# Patient Record
Sex: Male | Born: 2000 | Race: Black or African American | Hispanic: No | Marital: Single | Smoking: Never smoker
Health system: Southern US, Community
[De-identification: ages and names within clinical notes are randomized; demographics above are authoritative.]

## PROBLEM LIST (undated history)

## (undated) DIAGNOSIS — R519 Headache, unspecified: Secondary | ICD-10-CM

## (undated) DIAGNOSIS — J302 Other seasonal allergic rhinitis: Secondary | ICD-10-CM

## (undated) DIAGNOSIS — R51 Headache: Secondary | ICD-10-CM

## (undated) HISTORY — PX: WISDOM TOOTH EXTRACTION: SHX21

## (undated) HISTORY — DX: Headache: R51

## (undated) HISTORY — DX: Headache, unspecified: R51.9

---
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
## 2020-08-30 ENCOUNTER — Encounter (INDEPENDENT_AMBULATORY_CARE_PROVIDER_SITE_OTHER): Payer: Self-pay

## 2020-10-16 IMAGING — DX DG RIBS W/ CHEST 3+V*L*
5 series · 5 of 5 positions shown · non-contrast
Comparison: None.

CLINICAL DATA: Left rib pain

EXAM:
LEFT RIBS AND CHEST - 3+ VIEW

[chest pa]
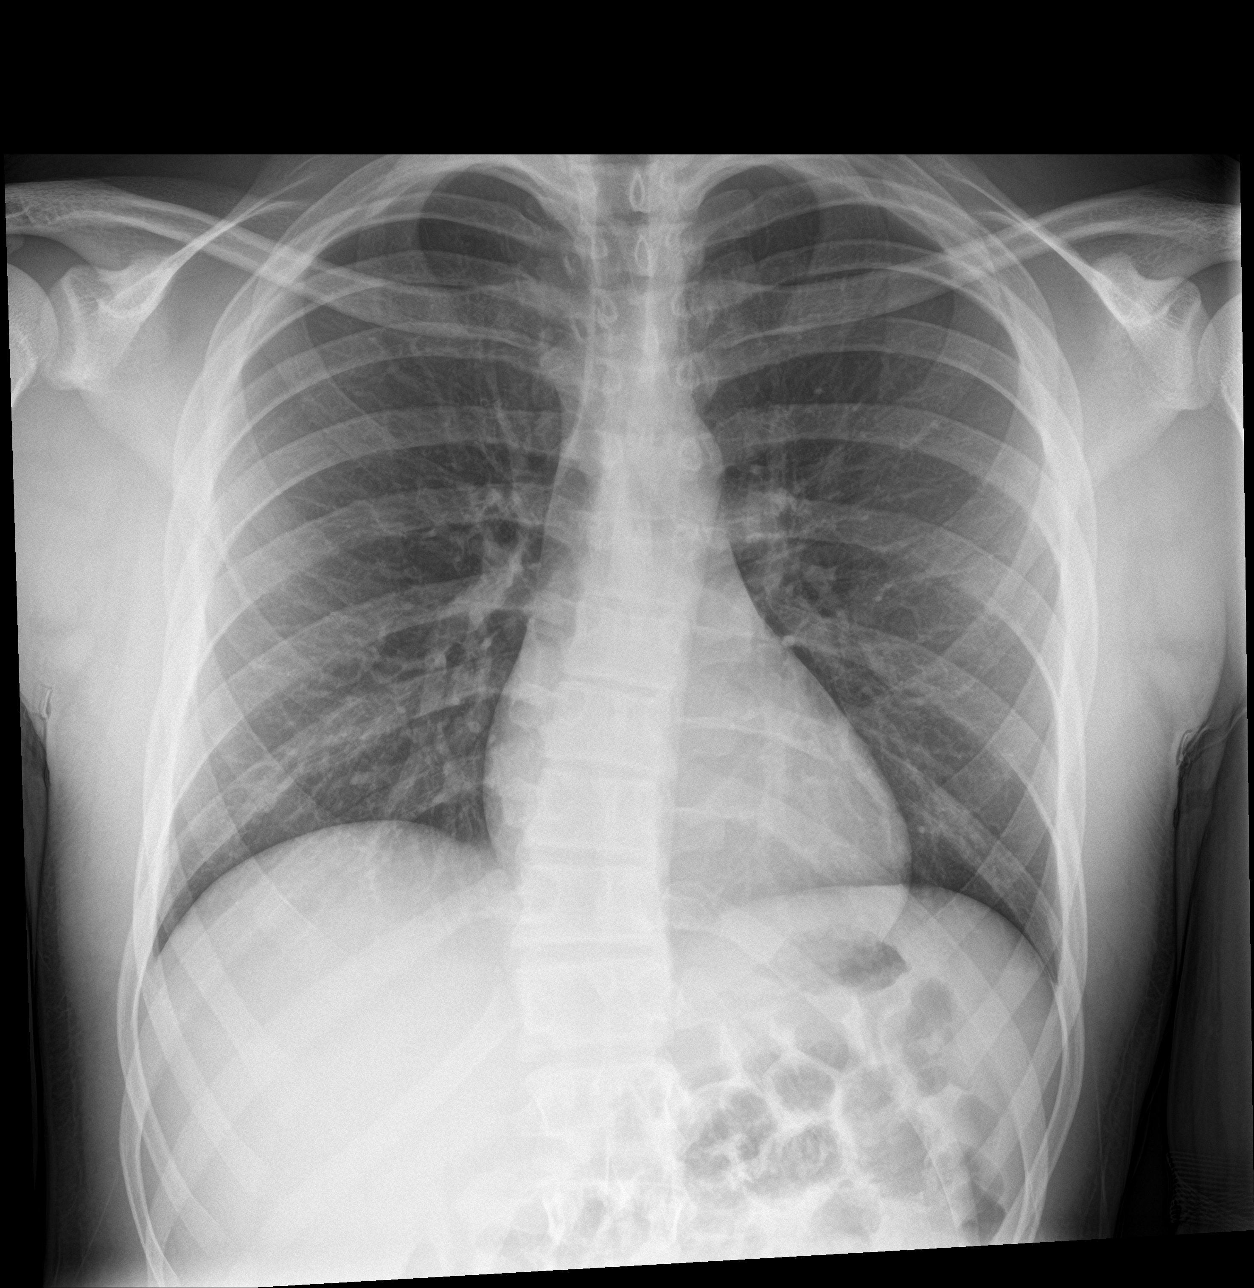

[rib pa obl (1 of 2)]
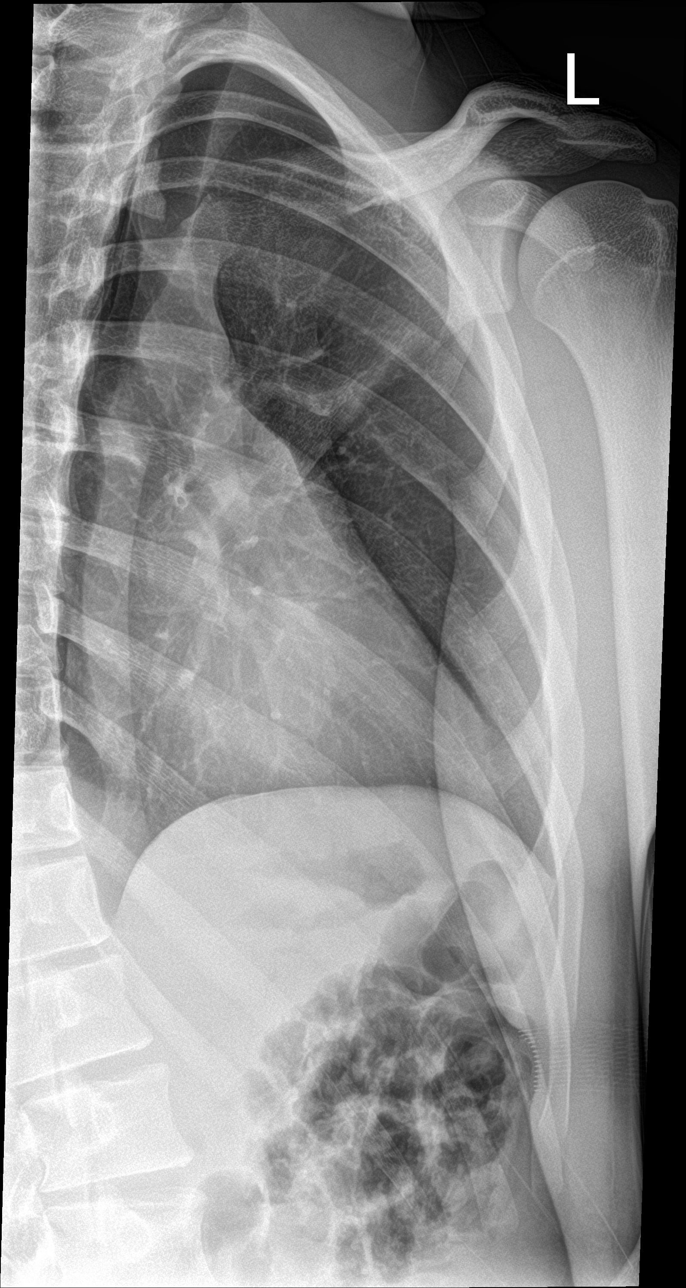

[rib pa obl (2 of 2)]
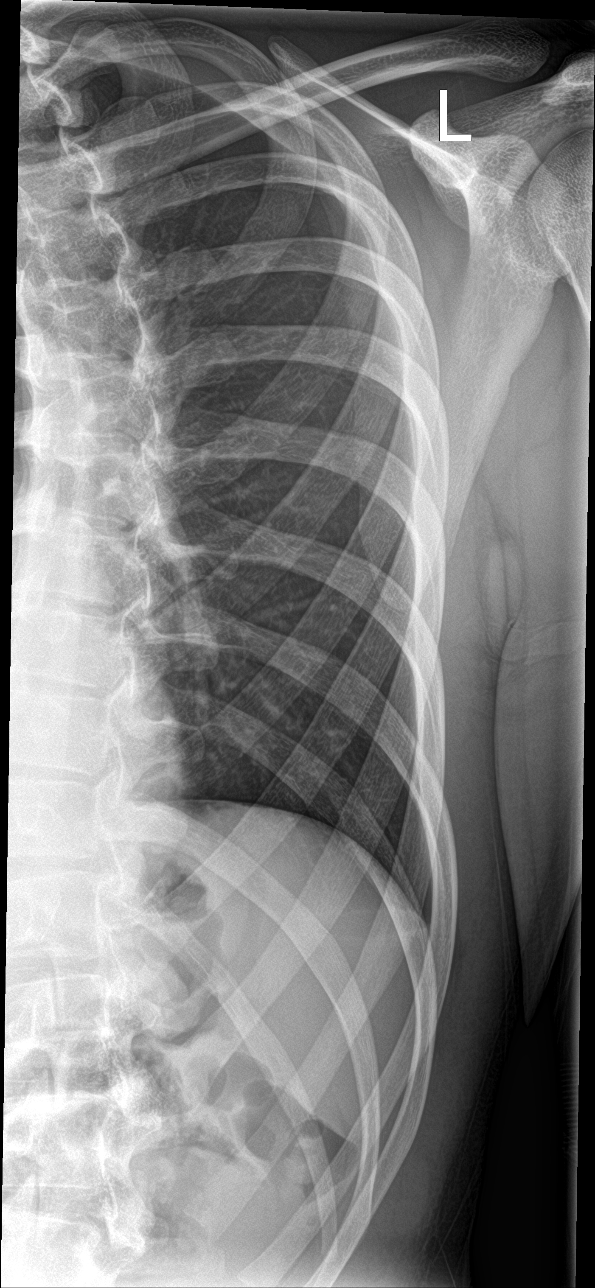

[rib pa]
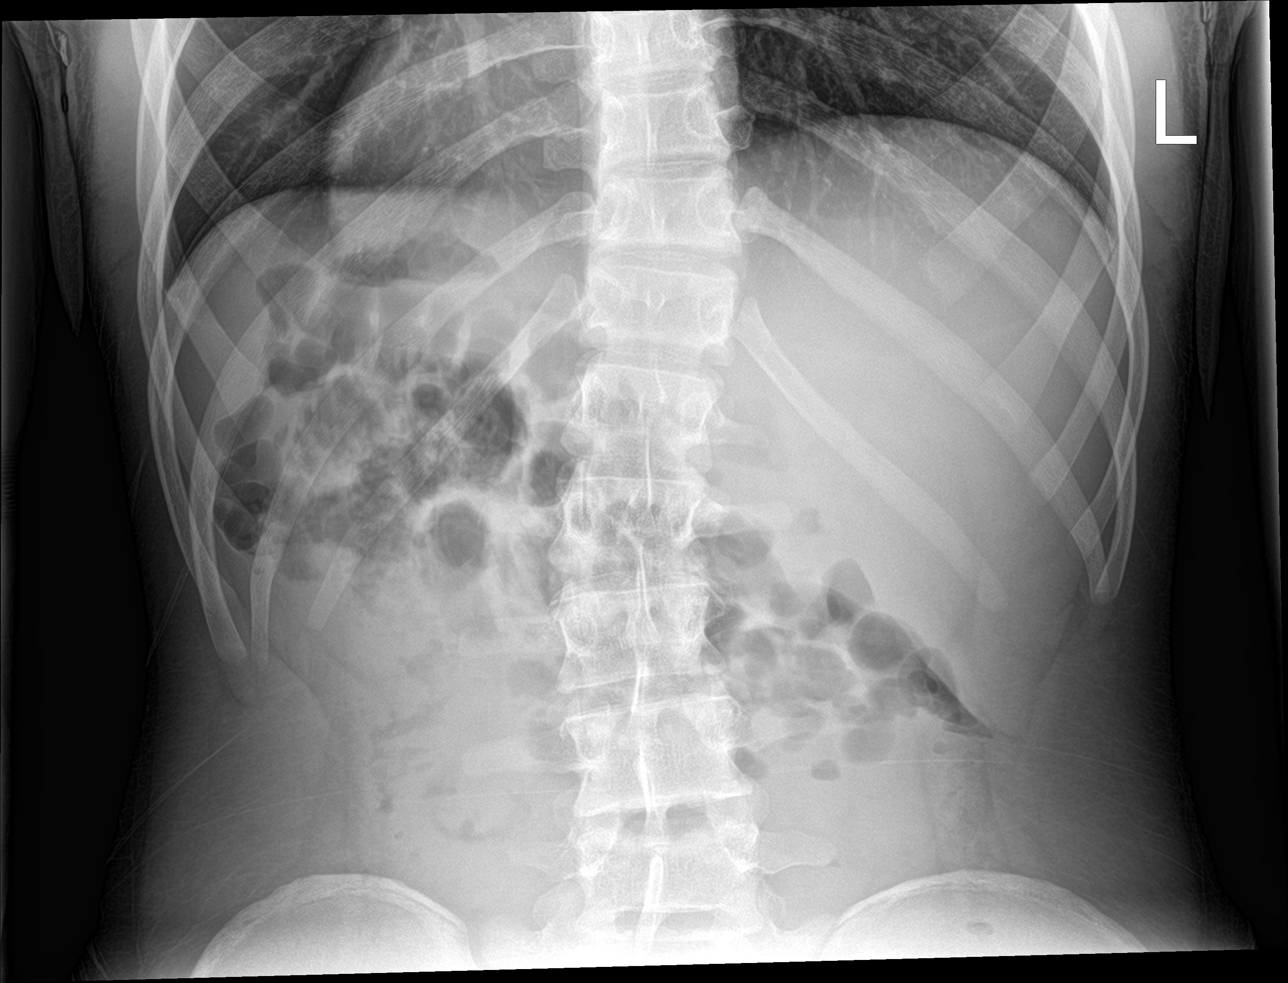

[rib ap]
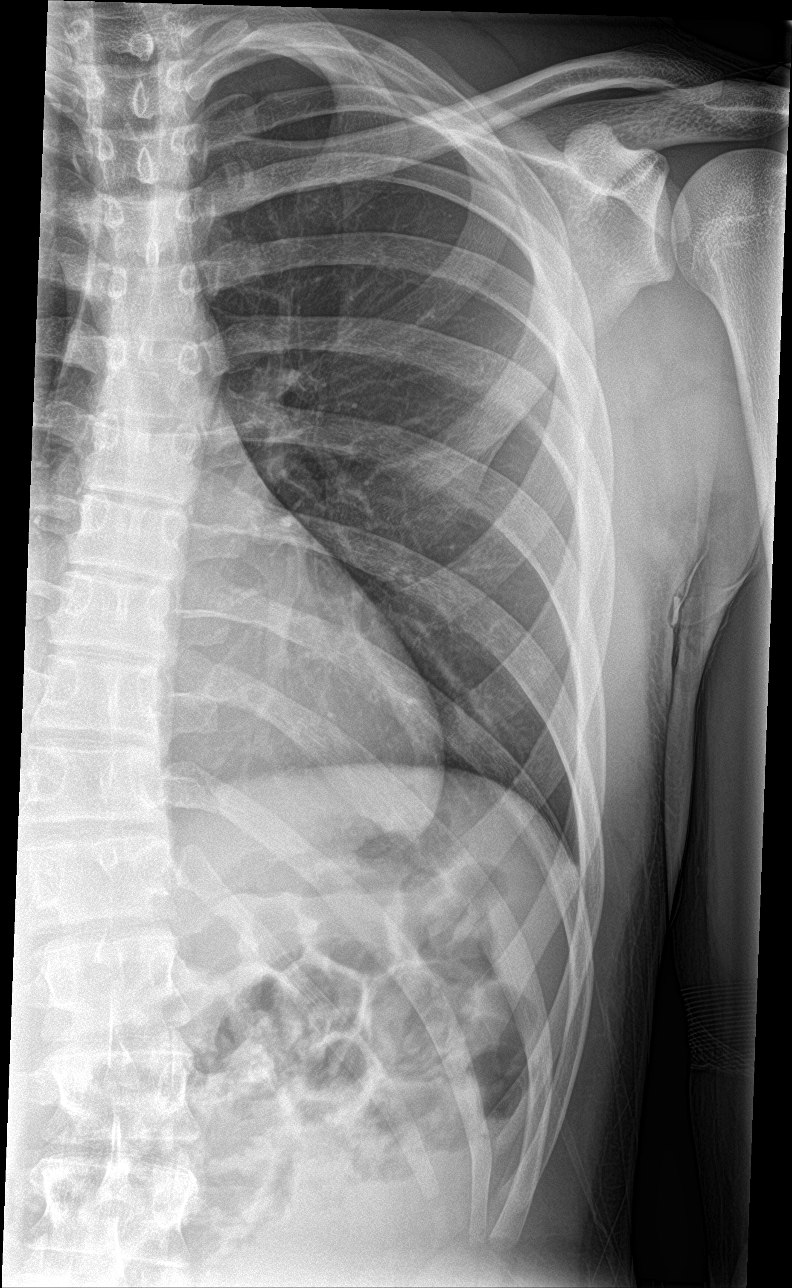

[5 of 5 positions shown; findings below may reference images not displayed]

FINDINGS: No fracture or other bone lesions are seen involving the ribs. There
is no evidence of pneumothorax or pleural effusion. Both lungs are
clear. Heart size and mediastinal contours are within normal limits.
IMPRESSION: Negative.

## 2020-11-13 ENCOUNTER — Emergency Department
Admission: RE | Admit: 2020-11-13 | Discharge: 2020-11-13 | Disposition: A | Discharge status: Home / Self Care | Payer: Managed Care, Other (non HMO) | Source: Ambulatory Visit | Attending: Family Medicine | Admitting: Family Medicine

## 2020-11-13 ENCOUNTER — Other Ambulatory Visit: Payer: Self-pay

## 2020-11-13 VITALS — BP 140/89 | HR 95 | Temp 99.3°F | Resp 18 | Ht 75.0 in | Wt 235.0 lb

## 2020-11-13 DIAGNOSIS — J039 Acute tonsillitis, unspecified: Secondary | ICD-10-CM | POA: Diagnosis not present

## 2020-11-13 HISTORY — DX: Other seasonal allergic rhinitis: J30.2

## 2020-11-13 LAB — POCT RAPID STREP A (OFFICE): Rapid Strep A Screen: NEGATIVE

## 2020-11-13 MED ORDER — DEXAMETHASONE 10 MG/ML FOR PEDIATRIC ORAL USE
10.0000 mg | Freq: Once | INTRAMUSCULAR | Status: AC
  Administered 2020-11-13: 10 mg via ORAL

## 2020-11-13 MED ORDER — AMOXICILLIN 875 MG PO TABS: 875.0000 mg | ORAL_TABLET | Freq: Two times a day (BID) | ORAL | 0 refills | Status: AC

## 2020-11-13 NOTE — ED Triage Notes (Signed)
Pt presents to Urgent Care with c/o swollen lymph nodes in throat/upper neck area which are painful and also bilateral otalgia x 4-5 days.

## 2020-11-13 NOTE — ED Provider Notes (Signed)
[facility]    CSN: [id number] Arrival date & time: 11/13/20  1349      History   Chief Complaint Chief Complaint  Patient presents with   Lymphadenopathy   Otalgia    HPI Jimmy Mcdonald is a 20 y.o. male.   HPI  Healthy 20 year old gentleman.  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  He is around kids all day long.  He is currently here with a sore throat.  Painful swallowing.  Ear pressure and pain.  Mild headache.  No fever or chills.  No malaise or fatigue.  No body aches.  Some decreased appetite.  Past Medical History:  Diagnosis Date   Headache    Seasonal allergies     Patient Active Problem List   Diagnosis Date Noted   Migraine without aura and without status migrainosus, not intractable 01/12/2018   Episodic tension-type headache, not intractable 01/12/2018    Past Surgical History:  Procedure Laterality Date   WISDOM TOOTH EXTRACTION         Home Medications    Prior to Admission medications   Medication Sig Start Date End Date Taking? Authorizing Provider  amoxicillin (AMOXIL) 875 MG tablet Take 1 tablet (875 mg total) by mouth 2 (two) times daily for 10 days. 11/13/20 11/23/20 Yes Eustace Moore, MD  ibuprofen (ADVIL) 800 MG tablet Take 1 tablet (800 mg total) by mouth every 8 (eight) hours as needed. 07/16/19   Roxy Horseman, PA-C  montelukast (SINGULAIR) 10 MG tablet  12/02/17   [provider]    Family History Family History  Problem Relation Age of Onset   Migraines Mother    Kidney disease Mother    Diabetes Father    Migraines Father     Social History Social History   Tobacco Use   Smoking status: Never   Smokeless tobacco: Never  Vaping Use   Vaping Use: Never used  Substance Use Topics   Alcohol use: No   Drug use: No     Allergies   Peanut-containing drug products, Shellfish allergy, and Soy allergy   Review of Systems Review of Systems See HPI  Physical Exam Triage Vital Signs ED Triage  Vitals  Enc Vitals Group     BP 11/13/20 1417 140/89     Pulse Rate 11/13/20 1417 95     Resp 11/13/20 1417 18     Temp 11/13/20 1417 99.3 F (37.4 C)     Temp Source 11/13/20 1417 Oral     SpO2 11/13/20 1417 97 %     Weight 11/13/20 1413 235 lb (106.6 kg)     Height 11/13/20 1413 6\' 3"  (1.905 m)     Head Circumference --      Peak Flow --      Pain Score 11/13/20 1413 8     Pain Loc --      Pain Edu? --      Excl. in GC? --    No data found.  Updated Vital Signs BP 140/89   Pulse 95   Temp 99.3 F (37.4 C) (Oral)   Resp 18   Ht 6\' 3"  (1.905 m)   Wt 106.6 kg   SpO2 97%   BMI 29.37 kg/m      Physical Exam Constitutional:      General: He is not in acute distress.    Appearance: He is well-developed.  HENT:     Head: Normocephalic and atraumatic.     Right  Ear: Tympanic membrane and ear canal normal.     Left Ear: Tympanic membrane and ear canal normal.     Nose: Nose normal. No congestion.     Mouth/Throat:     Mouth: Mucous membranes are moist.     Pharynx: Posterior oropharyngeal erythema present.     Comments: Large erythematous tonsils at almost touching midline.  No exudate.  Bilateral anterior cervical nodes at the angle of the jaw, tender mobile Eyes:     Conjunctiva/sclera: Conjunctivae normal.     Pupils: Pupils are equal, round, and reactive to light.  Cardiovascular:     Rate and Rhythm: Normal rate and regular rhythm.     Heart sounds: Normal heart sounds.  Pulmonary:     Effort: Pulmonary effort is normal. No respiratory distress.     Breath sounds: Normal breath sounds.  Abdominal:     General: There is no distension.     Palpations: Abdomen is soft.  Musculoskeletal:        General: Normal range of motion.     Cervical back: Normal range of motion.  Skin:    General: Skin is warm and dry.     Findings: No rash.  Neurological:     Mental Status: He is alert.     UC Treatments / Results  Labs (all labs ordered are listed, but only  abnormal results are displayed) Labs Reviewed  CULTURE, GROUP A STREP  POCT RAPID STREP A (OFFICE)    EKG   Radiology No results found.  Procedures Procedures (including critical care time)  Medications Ordered in UC Medications  dexamethasone (DECADRON) 10 MG/ML injection for Pediatric ORAL use 10 mg (10 mg Oral Given 11/13/20 1515)    Initial Impression / Assessment and Plan / UC Course  I have reviewed the triage vital signs and the nursing notes.  Pertinent labs & imaging results that were available during my care of the patient were reviewed by me and considered in my medical decision making (see chart for details).     Rapid strep is negative.  Will place on antibiotics pending throat culture given the tonsillitis appearance and painful throat.  Discussed pain management.  I gave him 1 dose of Decadron for the tonsillar swelling.  He knows to go to the ER if he gets worse instead of better has trouble swallowing or breathing Final Clinical Impressions(s) / UC Diagnoses   Final diagnoses:  Acute tonsillitis, unspecified etiology     Discharge Instructions      Take the amoxicillin 2 x a day Check My Chart for culture result If the culture is negative for strep you may stop the amoxicillin as soon as you improve If positive be sure to take 10 full days May take Tylenol or ibuprofen for pain. May try salt water gargles or Chloraseptic spray for throat pain. Call or return if not improving by the end of the week   ED Prescriptions     Medication Sig Dispense Auth. Provider   amoxicillin (AMOXIL) 875 MG tablet Take 1 tablet (875 mg total) by mouth 2 (two) times daily for 10 days. 20 tablet Eustace Moore, MD      PDMP not reviewed this encounter.   Eustace Moore, MD 11/13/20 [phone]

## 2020-11-13 NOTE — Discharge Instructions (Addendum)
Take the amoxicillin 2 x a day Check My Chart for culture result If the culture is negative for strep you may stop the amoxicillin as soon as you improve If positive be sure to take 10 full days May take Tylenol or ibuprofen for pain. May try salt water gargles or Chloraseptic spray for throat pain. Call or return if not improving by the end of the week

## 2020-11-15 LAB — CULTURE, GROUP A STREP: Strep A Culture: NEGATIVE

## 2020-12-07 ENCOUNTER — Emergency Department
Admission: EM | Admit: 2020-12-07 | Discharge: 2020-12-07 | Disposition: A | Discharge status: Home / Self Care | Payer: Managed Care, Other (non HMO) | Source: Home / Self Care

## 2020-12-07 ENCOUNTER — Other Ambulatory Visit: Payer: Self-pay

## 2020-12-07 ENCOUNTER — Encounter: Payer: Self-pay | Admitting: Emergency Medicine

## 2020-12-07 DIAGNOSIS — J039 Acute tonsillitis, unspecified: Secondary | ICD-10-CM | POA: Diagnosis not present

## 2020-12-07 DIAGNOSIS — J029 Acute pharyngitis, unspecified: Secondary | ICD-10-CM | POA: Diagnosis not present

## 2020-12-07 DIAGNOSIS — J309 Allergic rhinitis, unspecified: Secondary | ICD-10-CM | POA: Diagnosis not present

## 2020-12-07 MED ORDER — FEXOFENADINE HCL 180 MG PO TABS: 180.0000 mg | ORAL_TABLET | Freq: Every day | ORAL | 0 refills | Status: DC

## 2020-12-07 MED ORDER — AMOXICILLIN-POT CLAVULANATE 875-125 MG PO TABS: 1.0000 | ORAL_TABLET | Freq: Two times a day (BID) | ORAL | 0 refills | Status: DC

## 2020-12-07 NOTE — Discharge Instructions (Addendum)
Advised patient to take medication as directed with food to completion.  Advised patient to take Allegra daily with first dose of antibiotic for the next 7 days.  Advised/encouraged patient to increase daily water intake when taking these medications.  Advised/encouraged patient to follow-up with PCP for ENT consult for removal of tonsils.

## 2020-12-07 NOTE — ED Triage Notes (Signed)
Sore throat started 1 week after finishing antibiotics Productive cough x 1 week  Denies fever  Ibuprofen for pain - 200mg  at 0900 COVID 06/2019 No COVID vaccine

## 2020-12-07 NOTE — ED Provider Notes (Signed)
[facility]    CSN: [id number] Arrival date & time: 12/07/20  1138      History   Chief Complaint Chief Complaint  Patient presents with   Sore Throat    HPI Jimmy Mcdonald is a 20 y.o. male.   HPI 20 year old male presents with sore throat that started 1 week after finishing antibiotics prescribed on 11/13/2020 for acute tonsillitis.  Reports productive cough x7 days.  Denies fever and is not vaccinated for COVID-19.  Past Medical History:  Diagnosis Date   Headache    Seasonal allergies     Patient Active Problem List   Diagnosis Date Noted   Migraine without aura and without status migrainosus, not intractable 01/12/2018   Episodic tension-type headache, not intractable 01/12/2018    Past Surgical History:  Procedure Laterality Date   WISDOM TOOTH EXTRACTION         Home Medications    Prior to Admission medications   Medication Sig Start Date End Date Taking? Authorizing Provider  amoxicillin-clavulanate (AUGMENTIN) 875-125 MG tablet Take 1 tablet by mouth every 12 (twelve) hours. 12/07/20  Yes Trevor Iha, FNP  fexofenadine [facility] ALLERGY) 180 MG tablet Take 1 tablet (180 mg total) by mouth daily for 15 days. 12/07/20 12/22/20 Yes Trevor Iha, FNP  montelukast (SINGULAIR) 10 MG tablet  12/02/17  Yes [provider]  ibuprofen (ADVIL) 800 MG tablet Take 1 tablet (800 mg total) by mouth every 8 (eight) hours as needed. Patient not taking: Reported on 12/07/2020 07/16/19   Roxy Horseman, PA-C    Family History Family History  Problem Relation Age of Onset   Migraines Mother    Kidney disease Mother    Diabetes Father    Migraines Father     Social History Social History   Tobacco Use   Smoking status: Never   Smokeless tobacco: Never  Vaping Use   Vaping Use: Never used  Substance Use Topics   Alcohol use: No   Drug use: No     Allergies   Peanut-containing drug products, Shellfish allergy, and Soy  allergy   Review of Systems Review of Systems  HENT:  Positive for sore throat.   Respiratory:  Positive for cough.   All other systems reviewed and are negative.   Physical Exam Triage Vital Signs ED Triage Vitals  Enc Vitals Group     BP 12/07/20 1203 120/85     Pulse Rate 12/07/20 1203 69     Resp 12/07/20 1203 15     Temp 12/07/20 1203 98.5 F (36.9 C)     Temp Source 12/07/20 1203 Oral     SpO2 12/07/20 1203 98 %     Weight 12/07/20 1206 235 lb 0.2 oz (106.6 kg)     Height --      Head Circumference --      Peak Flow --      Pain Score 12/07/20 1205 4     Pain Loc --      Pain Edu? --      Excl. in GC? --    No data found.  Updated Vital Signs BP 120/85 (BP Location: Right Arm)   Pulse 69   Temp 98.5 F (36.9 C) (Oral)   Resp 15   Wt 235 lb 0.2 oz (106.6 kg)   SpO2 98%   BMI 29.37 kg/m      Physical Exam Vitals and nursing note reviewed.  Constitutional:      General: He is not  in acute distress.    Appearance: Normal appearance. He is normal weight. He is not ill-appearing.  HENT:     Head: Normocephalic and atraumatic.     Mouth/Throat:     Lips: Pink.     Pharynx: Oropharynx is clear. Uvula midline. Posterior oropharyngeal erythema and uvula swelling present.     Tonsils: 3+ on the right. 3+ on the left.     Comments: Moderate amount of clear drainage of posterior oropharynx noted Eyes:     Extraocular Movements: Extraocular movements intact.     Conjunctiva/sclera: Conjunctivae normal.     Pupils: Pupils are equal, round, and reactive to light.  Cardiovascular:     Rate and Rhythm: Normal rate and regular rhythm.     Pulses: Normal pulses.     Heart sounds: Normal heart sounds.  Pulmonary:     Effort: Pulmonary effort is normal.     Breath sounds: Normal breath sounds.  Musculoskeletal:        General: Normal range of motion.     Cervical back: Normal range of motion and neck supple. Tenderness present.  Lymphadenopathy:     Cervical:  Cervical adenopathy present.  Skin:    General: Skin is warm and dry.  Neurological:     General: No focal deficit present.     Mental Status: He is alert and oriented to person, place, and time. Mental status is at baseline.  Psychiatric:        Mood and Affect: Mood normal.        Behavior: Behavior normal.        Thought Content: Thought content normal.     UC Treatments / Results  Labs (all labs ordered are listed, but only abnormal results are displayed) Labs Reviewed - No data to display  EKG   Radiology No results found.  Procedures Procedures (including critical care time)  Medications Ordered in UC Medications - No data to display  Initial Impression / Assessment and Plan / UC Course  I have reviewed the triage vital signs and the nursing notes.  Pertinent labs & imaging results that were available during my care of the patient were reviewed by me and considered in my medical decision making (see chart for details).     MDM: 1.  Acute tonsillitis-Rx'd Augmentin; 2.  Allergic rhinitis-Rx'd Allegra. Advised patient to take medication as directed with food to completion.  Advised patient to take Allegra daily with first dose of antibiotic for the next 7 days.  Advised/encouraged patient to increase daily water intake when taking these medications.  Advised/encouraged patient to follow-up with PCP for ENT consult for removal of tonsils. Final Clinical Impressions(s) / UC Diagnoses   Final diagnoses:  Sore throat  Acute tonsillitis, unspecified etiology  Allergic rhinitis, unspecified seasonality, unspecified trigger     Discharge Instructions      Advised patient to take medication as directed with food to completion.  Advised patient to take Allegra daily with first dose of antibiotic for the next 7 days.  Advised/encouraged patient to increase daily water intake when taking these medications.  Advised/encouraged patient to follow-up with PCP for ENT consult  for removal of tonsils.     ED Prescriptions     Medication Sig Dispense Auth. Provider   amoxicillin-clavulanate (AUGMENTIN) 875-125 MG tablet Take 1 tablet by mouth every 12 (twelve) hours. 14 tablet Trevor Iha, FNP   fexofenadine [facility] ALLERGY) 180 MG tablet Take 1 tablet (180 mg total) by mouth daily  for 15 days. 15 tablet Trevor Iha, FNP      PDMP not reviewed this encounter.   Trevor Iha, FNP 12/07/20 1329

## 2023-03-15 ENCOUNTER — Ambulatory Visit (HOSPITAL_COMMUNITY)
Admission: EM | Admit: 2023-03-15 | Discharge: 2023-03-15 | Disposition: A | Discharge status: Home / Self Care | Payer: 59 | Attending: Physician Assistant | Admitting: Physician Assistant

## 2023-03-15 ENCOUNTER — Encounter (HOSPITAL_COMMUNITY): Payer: Self-pay | Admitting: Emergency Medicine

## 2023-03-15 DIAGNOSIS — J3489 Other specified disorders of nose and nasal sinuses: Secondary | ICD-10-CM | POA: Diagnosis not present

## 2023-03-15 DIAGNOSIS — J069 Acute upper respiratory infection, unspecified: Secondary | ICD-10-CM | POA: Diagnosis not present

## 2023-03-15 DIAGNOSIS — R0981 Nasal congestion: Secondary | ICD-10-CM | POA: Diagnosis not present

## 2023-03-15 DIAGNOSIS — R110 Nausea: Secondary | ICD-10-CM

## 2023-03-15 DIAGNOSIS — B309 Viral conjunctivitis, unspecified: Secondary | ICD-10-CM

## 2023-03-15 LAB — POC COVID19/FLU A&B COMBO
Covid Antigen, POC: NEGATIVE
Influenza A Antigen, POC: NEGATIVE
Influenza B Antigen, POC: NEGATIVE

## 2023-03-15 MED ORDER — ACETAMINOPHEN 325 MG PO TABS
ORAL_TABLET | ORAL | Status: AC
  Filled 2023-03-15: qty 2

## 2023-03-15 MED ORDER — ONDANSETRON 4 MG PO TBDP
ORAL_TABLET | ORAL | Status: AC
  Filled 2023-03-15: qty 1

## 2023-03-15 MED ORDER — IPRATROPIUM BROMIDE 0.03 % NA SOLN: 2.0000 | Freq: Two times a day (BID) | NASAL | 0 refills | Status: AC

## 2023-03-15 MED ORDER — ACETAMINOPHEN 325 MG PO TABS
650.0000 mg | ORAL_TABLET | Freq: Once | ORAL | Status: AC
  Administered 2023-03-15: 650 mg via ORAL

## 2023-03-15 MED ORDER — ONDANSETRON 4 MG PO TBDP
4.0000 mg | ORAL_TABLET | Freq: Once | ORAL | Status: AC
  Administered 2023-03-15: 4 mg via ORAL

## 2023-03-15 MED ORDER — ONDANSETRON 4 MG PO TBDP: 4.0000 mg | ORAL_TABLET | Freq: Three times a day (TID) | ORAL | 0 refills | Status: AC | PRN

## 2023-03-15 MED ORDER — PREDNISONE 10 MG (21) PO TBPK: ORAL_TABLET | ORAL | 0 refills | Status: AC

## 2023-03-15 NOTE — ED Provider Notes (Addendum)
[facility]    CSN: [id number] Arrival date & time: 03/15/23  1126      History   Chief Complaint Chief Complaint  Patient presents with   Nasal Congestion   Eye Problem    HPI Jimmy Mcdonald is a 22 y.o. male.   Patient presents today with a 2-day history of URI symptoms.  He reports headache, congestion, cough, drainage, dizziness, headache, sinus pressure.  He has also developed some nausea today.  Over the past few days he has had significant redness of both eyes with some tearing but denies any significant overnight drainage.  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  He has had COVID several years ago.  He does not have the COVID 19 vaccination.  He does have seasonal allergies but is not taking medication for this on a regular basis.  He was exposed to a friend's dog yesterday and his symptoms significantly worsened following this.  He reports that he is feeling very poorly and did not even feel that he could drive himself today.  He denies any recent antibiotics or steroids.    Past Medical History:  Diagnosis Date   Headache    Seasonal allergies     Patient Active Problem List   Diagnosis Date Noted   Migraine without aura and without status migrainosus, not intractable 01/12/2018   Episodic tension-type headache, not intractable 01/12/2018    Past Surgical History:  Procedure Laterality Date   WISDOM TOOTH EXTRACTION         Home Medications    Prior to Admission medications   Medication Sig Start Date End Date Taking? Authorizing Provider  ipratropium (ATROVENT) 0.03 % nasal spray Place 2 sprays into both nostrils every 12 (twelve) hours. 03/15/23  Yes Edina Winningham K, PA-C  ondansetron (ZOFRAN-ODT) 4 MG disintegrating tablet Take 1 tablet (4 mg total) by mouth every 8 (eight) hours as needed for nausea or vomiting. 03/15/23  Yes Garvey Westcott K, PA-C  predniSONE (STERAPRED UNI-PAK 21  TAB) 10 MG (21) TBPK tablet As directed 03/15/23  Yes Nafis Farnan, Noberto Retort, PA-C    Family History Family History  Problem Relation Age of Onset   Migraines Mother    Kidney disease Mother    Diabetes Father    Migraines Father     Social History Social History   Tobacco Use   Smoking status: Never   Smokeless tobacco: Never  Vaping Use   Vaping status: Never Used  Substance Use Topics   Alcohol use: No   Drug use: No     Allergies   Peanut-containing drug products, Shellfish allergy, and Soy allergy   Review of Systems Review of Systems  Constitutional:  Positive for activity change and fatigue. Negative for appetite change and fever.  HENT:  Positive for congestion, postnasal drip and sinus pressure. Negative for sneezing and sore throat.   Respiratory:  Positive for cough and shortness of breath.   Cardiovascular:  Negative for chest pain.  Gastrointestinal:  Positive for nausea. Negative for abdominal pain, diarrhea and vomiting.  Neurological:  Positive for dizziness and headaches. Negative for light-headedness.     Physical Exam Triage Vital Signs ED Triage Vitals  Encounter Vitals Group     BP 03/15/23 1247 (!) 127/92     Systolic BP Percentile --      Diastolic BP Percentile --      Pulse Rate 03/15/23 1247 79  Resp 03/15/23 1247 17     Temp 03/15/23 1247 98.2 F (36.8 C)     Temp Source 03/15/23 1246 Oral     SpO2 03/15/23 1247 96 %     Weight --      Height --      Head Circumference --      Peak Flow --      Pain Score 03/15/23 1246 8     Pain Loc --      Pain Education --      Exclude from Growth Chart --    No data found.  Updated Vital Signs BP (!) 127/92 (BP Location: Left Arm)   Pulse 79   Temp 98.2 F (36.8 C) (Oral)   Resp 17   SpO2 96%   Visual Acuity Right Eye Distance:   Left Eye Distance:   Bilateral Distance:    Right Eye Near:   Left Eye Near:    Bilateral Near:     Physical Exam Vitals reviewed.  Constitutional:       General: He is awake.     Appearance: Normal appearance. He is well-developed. He is not ill-appearing.     Comments: Very pleasant male appears stated age in no acute distress sitting comfortably exam room  HENT:     Head: Normocephalic and atraumatic.     Right Ear: Ear canal and external ear normal. A middle ear effusion is present. Tympanic membrane is not erythematous or bulging.     Left Ear: Tympanic membrane, ear canal and external ear normal. Tympanic membrane is not erythematous or bulging.     Nose: Congestion present.     Right Sinus: Maxillary sinus tenderness and frontal sinus tenderness present.     Left Sinus: Maxillary sinus tenderness and frontal sinus tenderness present.     Mouth/Throat:     Pharynx: Uvula midline. Posterior oropharyngeal erythema and postnasal drip present. No oropharyngeal exudate or uvula swelling.  Eyes:     Conjunctiva/sclera:     Right eye: Right conjunctiva is injected.     Left eye: Left conjunctiva is injected.  Cardiovascular:     Rate and Rhythm: Normal rate and regular rhythm.     Heart sounds: Normal heart sounds, S1 normal and S2 normal. No murmur heard. Pulmonary:     Effort: Pulmonary effort is normal. No accessory muscle usage or respiratory distress.     Breath sounds: Normal breath sounds. No stridor. No wheezing, rhonchi or rales.     Comments: Clear to auscultation bilaterally Lymphadenopathy:     Head:     Right side of head: No submental, submandibular or tonsillar adenopathy.     Left side of head: No submental, submandibular or tonsillar adenopathy.     Cervical: No cervical adenopathy.  Neurological:     Mental Status: He is alert.  Psychiatric:        Behavior: Behavior is cooperative.      UC Treatments / Results  Labs (all labs ordered are listed, but only abnormal results are displayed) Labs Reviewed  POC COVID19/FLU A&B COMBO    EKG   Radiology No results found.  Procedures Procedures  (including critical care time)  Medications Ordered in UC Medications  acetaminophen (TYLENOL) tablet 650 mg (650 mg Oral Given 03/15/23 1335)  ondansetron (ZOFRAN-ODT) disintegrating tablet 4 mg (4 mg Oral Given 03/15/23 1336)    Initial Impression / Assessment and Plan / UC Course  I have reviewed the triage vital signs and  the nursing notes.  Pertinent labs & imaging results that were available during my care of the patient were reviewed by me and considered in my medical decision making (see chart for details).     Patient is well-appearing, afebrile, nontoxic, nontachycardic.  No evidence of acute infection on physical exam that warrant initiation of antibiotics.  Suspect viral etiology.  COVID and flu testing was negative.  He was given Zofran and Tylenol with improvement of nausea symptoms but continued to have significant nasal congestion and sinus pressure.  He was started on a prednisone taper with instructions to take NSAIDs with this medication to risk of GI bleeding but can use acetaminophen/Tylenol.  He can use Mucinex, Flonase, Tylenol for symptom relief.  He was given Atrovent to help with congestion.  He was also given a prescription for Zofran to be used as needed for nausea and vomiting symptoms.  Recommended humidifier in his room as well as nasal saline sinus rinses for additional symptom relief.  Discussed that if his symptoms are not improving within a week he should return for reevaluation.  If he has any worsening or changing symptoms he should be seen immediately.  Strict return precautions given.  Work and school excuse notes provided.  Final Clinical Impressions(s) / UC Diagnoses   Final diagnoses:  Upper respiratory tract infection, unspecified type  Sinus pressure  Nasal congestion  Viral conjunctivitis  Nausea without vomiting     Discharge Instructions      I believe that you have a virus.  You tested negative for COVID and flu.  Start prednisone taper.   Do not take NSAIDs with this medication including aspirin, bupropion/Advil, naproxen/Aleve.  You can use acetaminophen/Tylenol and over-the-counter cold and flu medication.  Use Zofran every 8 hours as needed for nausea symptoms.  I recommend nasal saline and sinus rinses.  You can use ipratropium nasal spray to help with the congestion.  Make sure that you are resting and drinking plenty of fluid.  If your symptoms are not improving quickly (within 5 days) or if anything worsens and you have high fever, chest pain, shortness of breath, worsening cough, weakness you need to be seen immediately.     ED Prescriptions     Medication Sig Dispense Auth. Provider   ondansetron (ZOFRAN-ODT) 4 MG disintegrating tablet Take 1 tablet (4 mg total) by mouth every 8 (eight) hours as needed for nausea or vomiting. 20 tablet Gail Creekmore K, PA-C   predniSONE (STERAPRED UNI-PAK 21 TAB) 10 MG (21) TBPK tablet As directed 21 tablet Ariyana Faw K, PA-C   ipratropium (ATROVENT) 0.03 % nasal spray Place 2 sprays into both nostrils every 12 (twelve) hours. 30 mL Daneshia Tavano K, PA-C      PDMP not reviewed this encounter.   Jeani Hawking, PA-C 03/15/23 1439    Eriel Doyon, Noberto Retort, PA-C 03/15/23 1440

## 2023-03-15 NOTE — Discharge Instructions (Signed)
I believe that you have a virus.  You tested negative for COVID and flu.  Start prednisone taper.  Do not take NSAIDs with this medication including aspirin, bupropion/Advil, naproxen/Aleve.  You can use acetaminophen/Tylenol and over-the-counter cold and flu medication.  Use Zofran every 8 hours as needed for nausea symptoms.  I recommend nasal saline and sinus rinses.  You can use ipratropium nasal spray to help with the congestion.  Make sure that you are resting and drinking plenty of fluid.  If your symptoms are not improving quickly (within 5 days) or if anything worsens and you have high fever, chest pain, shortness of breath, worsening cough, weakness you need to be seen immediately.

## 2023-03-15 NOTE — ED Triage Notes (Signed)
Pt c/o bilateral eye irritation possible pink eye since yesterday. Pt also c/o headache, congestion for 4 days. He began to feel lightheaded while waiting to be seen
